# Patient Record
Sex: Female | Born: 1970 | Race: White | Hispanic: No | Marital: Married | Smoking: Former smoker
Health system: Southern US, Community
[De-identification: ages and names within clinical notes are randomized; demographics above are authoritative.]

## PROBLEM LIST (undated history)

## (undated) DIAGNOSIS — G43909 Migraine, unspecified, not intractable, without status migrainosus: Secondary | ICD-10-CM

## (undated) DIAGNOSIS — F329 Major depressive disorder, single episode, unspecified: Secondary | ICD-10-CM

## (undated) DIAGNOSIS — F32A Depression, unspecified: Secondary | ICD-10-CM

## (undated) HISTORY — PX: CHOLECYSTECTOMY: SHX55

---

## 2017-05-02 ENCOUNTER — Encounter (HOSPITAL_COMMUNITY): Payer: Self-pay | Admitting: Emergency Medicine

## 2017-05-02 ENCOUNTER — Emergency Department (HOSPITAL_COMMUNITY)
Admission: EM | Admit: 2017-05-02 | Discharge: 2017-05-03 | Disposition: A | Discharge status: Home / Self Care | Payer: BLUE CROSS/BLUE SHIELD | Attending: Emergency Medicine | Admitting: Emergency Medicine

## 2017-05-02 DIAGNOSIS — Z87891 Personal history of nicotine dependence: Secondary | ICD-10-CM | POA: Insufficient documentation

## 2017-05-02 DIAGNOSIS — D649 Anemia, unspecified: Secondary | ICD-10-CM

## 2017-05-02 DIAGNOSIS — R102 Pelvic and perineal pain: Secondary | ICD-10-CM

## 2017-05-02 DIAGNOSIS — D259 Leiomyoma of uterus, unspecified: Secondary | ICD-10-CM | POA: Diagnosis not present

## 2017-05-02 HISTORY — DX: Migraine, unspecified, not intractable, without status migrainosus: G43.909

## 2017-05-02 HISTORY — DX: Depression, unspecified: F32.A

## 2017-05-02 HISTORY — DX: Major depressive disorder, single episode, unspecified: F32.9

## 2017-05-02 NOTE — ED Triage Notes (Signed)
Pt states that she is having lower abd pain that started at [place] she is having problems with her utriesus prolapsing it is hurting in her back and lower. She was given 50 fentanyl in route.

## 2017-05-03 ENCOUNTER — Emergency Department (HOSPITAL_COMMUNITY): Payer: BLUE CROSS/BLUE SHIELD

## 2017-05-03 LAB — COMPREHENSIVE METABOLIC PANEL
ALT: 32 U/L (ref 14–54)
ANION GAP: 9 (ref 5–15)
AST: 23 U/L (ref 15–41)
Albumin: 4.5 g/dL (ref 3.5–5.0)
Alkaline Phosphatase: 86 U/L (ref 38–126)
BILIRUBIN TOTAL: 0.8 mg/dL (ref 0.3–1.2)
BUN: 17 mg/dL (ref 6–20)
CO2: 24 mmol/L (ref 22–32)
Calcium: 9.1 mg/dL (ref 8.9–10.3)
Chloride: 108 mmol/L (ref 101–111)
Creatinine, Ser: 0.79 mg/dL (ref 0.44–1.00)
Glucose, Bld: 114 mg/dL — ABNORMAL HIGH (ref 65–99)
POTASSIUM: 4.1 mmol/L (ref 3.5–5.1)
Sodium: 141 mmol/L (ref 135–145)
TOTAL PROTEIN: 7.1 g/dL (ref 6.5–8.1)

## 2017-05-03 LAB — CBC WITH DIFFERENTIAL/PLATELET
BASOS ABS: 0 10*3/uL (ref 0.0–0.1)
BASOS PCT: 0 %
Eosinophils Absolute: 0 10*3/uL (ref 0.0–0.7)
Eosinophils Relative: 0 %
HEMATOCRIT: 33.9 % — AB (ref 36.0–46.0)
Hemoglobin: 11.6 g/dL — ABNORMAL LOW (ref 12.0–15.0)
LYMPHS PCT: 8 %
Lymphs Abs: 0.9 10*3/uL (ref 0.7–4.0)
MCH: 31.9 pg (ref 26.0–34.0)
MCHC: 34.2 g/dL (ref 30.0–36.0)
MCV: 93.1 fL (ref 78.0–100.0)
Monocytes Absolute: 0.6 10*3/uL (ref 0.1–1.0)
Monocytes Relative: 5 %
NEUTROS ABS: 10.2 10*3/uL — AB (ref 1.7–7.7)
Neutrophils Relative %: 87 %
PLATELETS: 231 10*3/uL (ref 150–400)
RBC: 3.64 MIL/uL — AB (ref 3.87–5.11)
RDW: 12.4 % (ref 11.5–15.5)
WBC: 11.8 10*3/uL — AB (ref 4.0–10.5)

## 2017-05-03 LAB — URINALYSIS, ROUTINE W REFLEX MICROSCOPIC
Bilirubin Urine: NEGATIVE
Glucose, UA: NEGATIVE mg/dL
Hgb urine dipstick: NEGATIVE
KETONES UR: NEGATIVE mg/dL
LEUKOCYTES UA: NEGATIVE
NITRITE: NEGATIVE
PH: 6 (ref 5.0–8.0)
Protein, ur: NEGATIVE mg/dL
Specific Gravity, Urine: 1.012 (ref 1.005–1.030)

## 2017-05-03 LAB — POC URINE PREG, ED: Preg Test, Ur: NEGATIVE

## 2017-05-03 LAB — I-STAT CG4 LACTIC ACID, ED: Lactic Acid, Venous: 1.67 mmol/L (ref 0.5–1.9)

## 2017-05-03 MED ORDER — SODIUM CHLORIDE 0.9 % IV BOLUS (SEPSIS)
1000.0000 mL | Freq: Once | INTRAVENOUS | Status: AC
  Administered 2017-05-03: 1000 mL via INTRAVENOUS

## 2017-05-03 MED ORDER — OXYCODONE-ACETAMINOPHEN 5-325 MG PO TABS
1.0000 | ORAL_TABLET | Freq: Once | ORAL | Status: AC
  Administered 2017-05-03: 1 via ORAL
  Filled 2017-05-03: qty 1

## 2017-05-03 MED ORDER — OXYCODONE-ACETAMINOPHEN 5-325 MG PO TABS: 1.0000 | ORAL_TABLET | ORAL | 0 refills | Status: AC | PRN

## 2017-05-03 MED ORDER — MORPHINE SULFATE (PF) 4 MG/ML IV SOLN
4.0000 mg | Freq: Once | INTRAVENOUS | Status: AC
  Administered 2017-05-03: 4 mg via INTRAVENOUS
  Filled 2017-05-03: qty 1

## 2017-05-03 MED ORDER — IOPAMIDOL (ISOVUE-300) INJECTION 61%
100.0000 mL | Freq: Once | INTRAVENOUS | Status: AC | PRN
  Administered 2017-05-03: 100 mL via INTRAVENOUS

## 2017-05-03 MED ORDER — ONDANSETRON HCL 4 MG PO TABS: 4.0000 mg | ORAL_TABLET | Freq: Four times a day (QID) | ORAL | 0 refills | Status: AC | PRN

## 2017-05-03 MED ORDER — ONDANSETRON HCL 4 MG/2ML IJ SOLN
4.0000 mg | Freq: Once | INTRAMUSCULAR | Status: AC
  Administered 2017-05-03: 4 mg via INTRAVENOUS
  Filled 2017-05-03: qty 2

## 2017-05-03 NOTE — ED Notes (Signed)
Family at bedside.pt resting in bed pt giving something to drink

## 2017-05-03 NOTE — ED Provider Notes (Signed)
[facility] DEPT Provider Note   CSN: [id number] Arrival date & time: 05/02/17  2330     History   Chief Complaint Chief Complaint  Patient presents with  . Abdominal Pain    HPI Robin Buck is a 46 y.o. female.  The history is provided by the patient.  She has been having vaginal and pelvic pain for the last 7 weeks and was diagnosed with uterine prolapse. She has been referred to a gynecologist in [place], but appointment is not for another 5 weeks. Tonight, she started having severe pain in the vaginal area and across the pubic area with some radiation to the back. She rated pain at 10/10. This is associated with chills, sweats, nausea. She did not take anything for pain. She called for an ambulance who gave her fentanyl which did give her significant relief. She also received ondansetron with significant relief of nausea. She states pain is now starting to come back. She has been diagnosed with a uterine fibroids. She has not had a menses in the last 5 years since birth of her child, and has been found to not be producing estrogen.  Past Medical History:  Diagnosis Date  . Depression   . Migraines     There are no active problems to display for this patient.   Past Surgical History:  Procedure Laterality Date  . CHOLECYSTECTOMY      OB History    No data available       Home Medications    Prior to Admission medications   Not on File    Family History History reviewed. No pertinent family history.  Social History Social History  Substance Use Topics  . Smoking status: Former Smoker    Quit date: 12/30/2016  . Smokeless tobacco: Never Used  . Alcohol use No     Allergies   No known allergies   Review of Systems Review of Systems  All other systems reviewed and are negative.    Physical Exam Updated Vital Signs BP (!) 102/57   Pulse 87   Temp 99.4 F (37.4 C) (Oral)   Resp 16   Ht 5' 5.5" (1.664 m)   Wt 64.4 kg (142 lb)    SpO2 99%   BMI 23.27 kg/m   Physical Exam  Nursing note and vitals reviewed.  46 year old female, resting comfortably and in no acute distress. Vital signs are normal. Oxygen saturation is 99%, which is normal. Head is normocephalic and atraumatic. PERRLA, EOMI. Oropharynx is clear. Neck is nontender and supple without adenopathy or JVD. Back is nontender and there is no CVA tenderness. Lungs are clear without rales, wheezes, or rhonchi. Chest is nontender. Heart has regular rate and rhythm without murmur. Abdomen is soft, flat, with mild to moderate tenderness across the suprapubic area. There are no masses or hepatosplenomegaly and peristalsis is hypoactive. Pelvic: Normal external female genitalia. Cervix appears normal and is in normal position. No evidence of uterine prolapse. Bimanual exam is difficult secondary to atrophy of the introitus, but there is bilateral adnexal tenderness. Extremities have no cyanosis or edema, full range of motion is present. Skin is warm and dry without rash. Neurologic: Mental status is normal, cranial nerves are intact, there are no motor or sensory deficits.  ED Treatments / Results  Labs (all labs ordered are listed, but only abnormal results are displayed) Labs Reviewed  COMPREHENSIVE METABOLIC PANEL - Abnormal; Notable for the following:       Result Value  Glucose, Bld 114 (*)    All other components within normal limits  CBC WITH DIFFERENTIAL/PLATELET - Abnormal; Notable for the following:    WBC 11.8 (*)    RBC 3.64 (*)    Hemoglobin 11.6 (*)    HCT 33.9 (*)    Neutro Abs 10.2 (*)    All other components within normal limits  URINALYSIS, ROUTINE W REFLEX MICROSCOPIC  I-STAT CG4 LACTIC ACID, ED  POC URINE PREG, ED     Radiology US Pelvis Transvanginal Non-ob (tv Only)  Result Date: 05/03/2017 CLINICAL DATA:  Pelvic pain for 6 weeks. Unsure last menstrual period. Fibroid found on CT today. EXAM: TRANSABDOMINAL AND TRANSVAGINAL  ULTRASOUND OF PELVIS DOPPLER ULTRASOUND OF OVARIES TECHNIQUE: Both transabdominal and transvaginal ultrasound examinations of the pelvis were performed. Transabdominal technique was performed for global imaging of the pelvis including uterus, ovaries, adnexal regions, and pelvic cul-de-sac. It was necessary to proceed with endovaginal exam following the transabdominal exam to visualize the uterus and ovaries. Color and duplex Doppler ultrasound was utilized to evaluate blood flow to the ovaries. COMPARISON:  CT abdomen and pelvis 05/03/2017 FINDINGS: Uterus Measurements: 6.5 x 3 x 3.6 cm. Uterus is anteverted. Heterogeneous myometrium with anterior calcification consistent with fibroids. Endometrium Thickness: 2.5 mm. Focal echogenic structure appears to be within the myometrium. A focal endometrial lesion is not excluded. Consider short-term follow-up versus on a histogram for further evaluation. Right ovary Right ovary is not visualized due to bowel gas. Left ovary Left ovary is not visualized due to bowel gas. Pulsed Doppler evaluation of the ovaries could not be obtained as they were not visualized. Color flow Doppler imaging of the uterus shows normal homogeneous flow. Other findings No abnormal free fluid. IMPRESSION: Calcified fibroids in the uterus. Normal endometrial stripe thickness but suggestion of focal echogenic structure. Focal endometrial lesion or polyp not excluded. Consider further evaluation with sonohysterogram for confirmation prior to hysteroscopy. Endometrial sampling should also be considered if patient is at high risk for endometrial carcinoma. (Ref: Radiological Reasoning: Algorithmic Workup of Abnormal Vaginal Bleeding with Endovaginal Sonography and Sonohysterography. AJR 2008; 762:G31-51) Ovaries were not visualized due to overlying bowel gas. Electronically Signed   By: Lucienne Capers M.D.   On: 05/03/2017 07:04   US Pelvis Complete  Result Date: 05/03/2017 CLINICAL DATA:  Pelvic  pain for 6 weeks. Unsure last menstrual period. Fibroid found on CT today. EXAM: TRANSABDOMINAL AND TRANSVAGINAL ULTRASOUND OF PELVIS DOPPLER ULTRASOUND OF OVARIES TECHNIQUE: Both transabdominal and transvaginal ultrasound examinations of the pelvis were performed. Transabdominal technique was performed for global imaging of the pelvis including uterus, ovaries, adnexal regions, and pelvic cul-de-sac. It was necessary to proceed with endovaginal exam following the transabdominal exam to visualize the uterus and ovaries. Color and duplex Doppler ultrasound was utilized to evaluate blood flow to the ovaries. COMPARISON:  CT abdomen and pelvis 05/03/2017 FINDINGS: Uterus Measurements: 6.5 x 3 x 3.6 cm. Uterus is anteverted. Heterogeneous myometrium with anterior calcification consistent with fibroids. Endometrium Thickness: 2.5 mm. Focal echogenic structure appears to be within the myometrium. A focal endometrial lesion is not excluded. Consider short-term follow-up versus on a histogram for further evaluation. Right ovary Right ovary is not visualized due to bowel gas. Left ovary Left ovary is not visualized due to bowel gas. Pulsed Doppler evaluation of the ovaries could not be obtained as they were not visualized. Color flow Doppler imaging of the uterus shows normal homogeneous flow. Other findings No abnormal free fluid. IMPRESSION: Calcified fibroids in  the uterus. Normal endometrial stripe thickness but suggestion of focal echogenic structure. Focal endometrial lesion or polyp not excluded. Consider further evaluation with sonohysterogram for confirmation prior to hysteroscopy. Endometrial sampling should also be considered if patient is at high risk for endometrial carcinoma. (Ref: Radiological Reasoning: Algorithmic Workup of Abnormal Vaginal Bleeding with Endovaginal Sonography and Sonohysterography. AJR 2008; 778:E42-35) Ovaries were not visualized due to overlying bowel gas. Electronically Signed   By:  Lucienne Capers M.D.   On: 05/03/2017 07:04   Ct Abdomen Pelvis W Contrast  Result Date: 05/03/2017 CLINICAL DATA:  Lower abdominal pain. Problems with uterine prolapse causing pain in the low back. EXAM: CT ABDOMEN AND PELVIS WITH CONTRAST TECHNIQUE: Multidetector CT imaging of the abdomen and pelvis was performed using the standard protocol following bolus administration of intravenous contrast. CONTRAST:  151mL ISOVUE-300 IOPAMIDOL (ISOVUE-300) INJECTION 61% COMPARISON:  None. FINDINGS: Lower chest: The lung bases are clear. Hepatobiliary: No focal liver lesions. Surgical absence of the gallbladder. Mild intra and extrahepatic bile duct dilatation is likely normal for postoperative physiology. Pancreas: Unremarkable. No pancreatic ductal dilatation or surrounding inflammatory changes. Spleen: Normal in size without focal abnormality. Adrenals/Urinary Tract: Adrenal glands are unremarkable. Kidneys are normal, without renal calculi, focal lesion, or hydronephrosis. Bladder is unremarkable. Stomach/Bowel: Stomach, small bowel, and colon are not abnormally distended. No wall thickening is appreciated. No inflammatory changes. Appendix is normal. Fluid-filled colon may be associated with diarrhea. Vascular/Lymphatic: Few scattered calcifications in the aorta. No aneurysm. No significant lymphadenopathy. Reproductive: Uterus and ovaries are not enlarged. Calcification in the anterior uterus probably represents a small calcified fibroid. Other: No abdominal wall hernia or abnormality. No abdominopelvic ascites. Musculoskeletal: No acute or significant osseous findings. IMPRESSION: No acute process demonstrated in the abdomen or pelvis. No evidence of bowel obstruction or inflammation. Small calcified uterine fibroid. Electronically Signed   By: Lucienne Capers M.D.   On: 05/03/2017 04:20   US Pelvic Doppler (torsion R/o Or Mass Arterial Flow)  Result Date: 05/03/2017 CLINICAL DATA:  Pelvic pain for 6 weeks.  Unsure last menstrual period. Fibroid found on CT today. EXAM: TRANSABDOMINAL AND TRANSVAGINAL ULTRASOUND OF PELVIS DOPPLER ULTRASOUND OF OVARIES TECHNIQUE: Both transabdominal and transvaginal ultrasound examinations of the pelvis were performed. Transabdominal technique was performed for global imaging of the pelvis including uterus, ovaries, adnexal regions, and pelvic cul-de-sac. It was necessary to proceed with endovaginal exam following the transabdominal exam to visualize the uterus and ovaries. Color and duplex Doppler ultrasound was utilized to evaluate blood flow to the ovaries. COMPARISON:  CT abdomen and pelvis 05/03/2017 FINDINGS: Uterus Measurements: 6.5 x 3 x 3.6 cm. Uterus is anteverted. Heterogeneous myometrium with anterior calcification consistent with fibroids. Endometrium Thickness: 2.5 mm. Focal echogenic structure appears to be within the myometrium. A focal endometrial lesion is not excluded. Consider short-term follow-up versus on a histogram for further evaluation. Right ovary Right ovary is not visualized due to bowel gas. Left ovary Left ovary is not visualized due to bowel gas. Pulsed Doppler evaluation of the ovaries could not be obtained as they were not visualized. Color flow Doppler imaging of the uterus shows normal homogeneous flow. Other findings No abnormal free fluid. IMPRESSION: Calcified fibroids in the uterus. Normal endometrial stripe thickness but suggestion of focal echogenic structure. Focal endometrial lesion or polyp not excluded. Consider further evaluation with sonohysterogram for confirmation prior to hysteroscopy. Endometrial sampling should also be considered if patient is at high risk for endometrial carcinoma. (Ref: Radiological Reasoning: Algorithmic Workup of Abnormal  Vaginal Bleeding with Endovaginal Sonography and Sonohysterography. AJR 2008; 381:W29-93) Ovaries were not visualized due to overlying bowel gas. Electronically Signed   By: Lucienne Capers M.D.    On: 05/03/2017 07:04    Procedures Procedures (including critical care time)  Medications Ordered in ED Medications  oxyCODONE-acetaminophen (PERCOCET/ROXICET) 5-325 MG per tablet 1 tablet (not administered)  ondansetron (ZOFRAN) injection 4 mg (4 mg Intravenous Given 05/03/17 0019)  sodium chloride 0.9 % bolus 1,000 mL (1,000 mLs Intravenous New Bag/Given 05/03/17 0242)  morphine 4 MG/ML injection 4 mg (4 mg Intravenous Given 05/03/17 0242)  morphine 4 MG/ML injection 4 mg (4 mg Intravenous Given 05/03/17 0335)  iopamidol (ISOVUE-300) 61 % injection 100 mL (100 mLs Intravenous Contrast Given 05/03/17 0358)  morphine 4 MG/ML injection 4 mg (4 mg Intravenous Given 05/03/17 0443)     Initial Impression / Assessment and Plan / ED Course  I have reviewed the triage vital signs and the nursing notes.  Pertinent labs & imaging results that were available during my care of the patient were reviewed by me and considered in my medical decision making (see chart for details).  Pelvic pain in patient with known history of uterine prolapse and uterine fibroids. She'll be given IV fluids, morphine for pain. Screening labs obtained including lactic acid level. She has no prior records and the [facility] system.  Exam x-ray shows no evidence of uterine prolapse. She will be sent for CT of abdomen and pelvis.  CT scan does not show any obvious cause for pain. Patient has been getting adequate relief of pain with morphine, but pain has recurred several times. She will be sent for pelvic ultrasound to rule out ovarian torsion or torsion of uterine fibroid.  Pelvic ultrasound does show presence of uterine fibroids, but is otherwise unremarkable. Laboratory workup shows mild leukocytosis, mild anemia, and otherwise unremarkable. No clear cause for patient's pain is identified. At this point, she does seem to get adequate pain relief with narcotics, so she is sent home with prescription for oxycodone  acetaminophen and ondansetron. She is referred to gynecology for follow-up. Return precautions discussed.  Final Clinical Impressions(s) / ED Diagnoses   Final diagnoses:  Pelvic pain in female  Uterine leiomyoma, unspecified location  Normochromic normocytic anemia    New Prescriptions New Prescriptions   ONDANSETRON (ZOFRAN) 4 MG TABLET    Take 1 tablet (4 mg total) by mouth every 6 (six) hours as needed.   OXYCODONE-ACETAMINOPHEN (PERCOCET) 5-325 MG TABLET    Take 1 tablet by mouth every 4 (four) hours as needed for moderate pain.     Delora Fuel, MD 71/69/67 [phone]

## 2017-05-03 NOTE — Discharge Instructions (Signed)
The cause of your pain was not clear today. Please make an appointment with the gynecologist for further evaluation. Return to the emergency department if pain is not being adequately controlled at home, if you are vomiting, or if you start running a high fever.

## 2017-05-03 NOTE — ED Notes (Signed)
ED Provider at bedside. 

## 2017-05-05 ENCOUNTER — Encounter: Payer: Self-pay | Admitting: Obstetrics & Gynecology

## 2017-05-05 ENCOUNTER — Telehealth: Payer: Self-pay | Admitting: *Deleted

## 2017-05-05 ENCOUNTER — Ambulatory Visit (INDEPENDENT_AMBULATORY_CARE_PROVIDER_SITE_OTHER): Payer: BLUE CROSS/BLUE SHIELD | Admitting: Obstetrics & Gynecology

## 2017-05-05 VITALS — BP 100/70 | HR 72 | Wt 144.0 lb

## 2017-05-05 DIAGNOSIS — R102 Pelvic and perineal pain: Secondary | ICD-10-CM

## 2017-05-05 MED ORDER — CIPROFLOXACIN HCL 500 MG PO TABS: 500.0000 mg | ORAL_TABLET | Freq: Two times a day (BID) | ORAL | 0 refills | Status: AC

## 2017-05-05 MED ORDER — DICYCLOMINE HCL 20 MG PO TABS: 20.0000 mg | ORAL_TABLET | Freq: Three times a day (TID) | ORAL | 1 refills | Status: AC

## 2017-05-05 MED ORDER — METRONIDAZOLE 500 MG PO TABS: 500.0000 mg | ORAL_TABLET | Freq: Two times a day (BID) | ORAL | 0 refills | Status: AC

## 2017-05-05 NOTE — Telephone Encounter (Signed)
Patient called with complaints of continued pain. She is taking the Percocet but states she has a 47 year old at home, so taking them regularly is not ideal. She is noted to have uterine fibroids and wants to be evaluated ASAP for possible hysterectomy. Was told she could see dr Glo Herring today but informed he was not in the office until Wednesday.  Will w/i with Dr Elonda Husky.

## 2017-05-05 NOTE — Progress Notes (Signed)
Chief Complaint  Patient presents with  . follow-up- ER    fibroids    Blood pressure 100/70, pulse 72, weight 144 lb (65.3 kg).  46 y.o. G3P0 No LMP recorded. The current method of family planning is tubal ligation with her last c section.  Outpatient Encounter Medications as of 05/05/2017  Medication Sig  . ondansetron (ZOFRAN) 4 MG tablet Take 1 tablet (4 mg total) by mouth every 6 (six) hours as needed.  . ciprofloxacin (CIPRO) 500 MG tablet Take 1 tablet (500 mg total) by mouth 2 (two) times daily.  Marland Kitchen dicyclomine (BENTYL) 20 MG tablet Take 1 tablet (20 mg total) by mouth 3 (three) times daily before meals.  . metroNIDAZOLE (FLAGYL) 500 MG tablet Take 1 tablet (500 mg total) by mouth 2 (two) times daily.  Marland Kitchen oxyCODONE-acetaminophen (PERCOCET) 5-325 MG tablet Take 1 tablet by mouth every 4 (four) hours as needed for moderate pain. (Patient not taking: Reported on 05/05/2017)   No facility-administered encounter medications on file as of 05/05/2017.     Subjective And was referred here from my ER visit Essentially told that she had a fibroid that was causing her pain She's been having pain for quite some time along with diarrhea Her pain is very episodic Occurs about once a week is terrible 4-6 hours and completely resolves This is been going on for about the past 6 weeks or so since August 1 She has no fever although she does break out in a cold sweat when she has the pain Nothing makes it better heating pad pain medicine nothing  Objective General WDWN female NAD Vulva:  normal appearing vulva with no masses, tenderness or lesions Vagina:  normal mucosa, no discharge Cervix:  no cervical motion tenderness and no lesions Uterus:  normal size, contour, position, consistency, mobility, non-tender Adnexa: ovaries:present,  normal adnexa in size, nontender and no masses   Pertinent ROS No burning with urination, frequency or urgency No nausea, vomiting or diarrhea Nor  fever chills or other constitutional symptoms   Labs or studies CT scan from outside institution reviewed as well as her labs    Impression Diagnoses this Encounter::   ICD-10-CM   1. Pelvic pain R10.2     Established relevant diagnosis(es):   Plan/Recommendations: Meds ordered this encounter  Medications  . ciprofloxacin (CIPRO) 500 MG tablet    Sig: Take 1 tablet (500 mg total) by mouth 2 (two) times daily.    Dispense:  20 tablet    Refill:  0  . metroNIDAZOLE (FLAGYL) 500 MG tablet    Sig: Take 1 tablet (500 mg total) by mouth 2 (two) times daily.    Dispense:  20 tablet    Refill:  0  . dicyclomine (BENTYL) 20 MG tablet    Sig: Take 1 tablet (20 mg total) by mouth 3 (three) times daily before meals.    Dispense:  90 tablet    Refill:  1    Labs or Scans Ordered: No orders of the defined types were placed in this encounter.   Management:: Honestly I'm pretty baffled about what's going on with Gianny To re-cap She has very episodic pain about once a week which last may be 4-6 hours And it typically resolves and completely goes away She does have a history of diarrhea and irritable bowel syndrome and has been having more of that since August 1 In fact she's had to self staining episodes since her unresolved episode started  on Friday Her CT scan is completely normal and her ultrasound is completely normal To reiterate she does not have pathology related to fibroid she has one calcification which is less than a centimeter and an examination she absolutely positively has no pelvic organ prolapse Her back exam is normal her abdominal exam is relatively benign except for some tenderness in the lower quadrants no rebound So I'm going to treat her empirically with Cipro and Flagyl for 10 days and Bentyl 20 mg 3 times a day, to see if her diarrhea gets better and see what her pain does Either way I think this is helpful to a gastroenterologist if she were to need to go see  them in the future I will see her back in 2 weeks and she is, keep pretty close track of her pain diarrhea and any other associated symptoms between now and then and we will reevaluate then regarding her differential and care management going forward  Follow up Return in about 2 weeks (around 05/19/2017) for Follow up, with Dr Elonda Husky.      All questions were answered.  Past Medical History:  Diagnosis Date  . Depression   . Migraines     Past Surgical History:  Procedure Laterality Date  . CHOLECYSTECTOMY      OB History    Gravida Para Term Preterm AB Living   3         3   SAB TAB Ectopic Multiple Live Births                  Allergies  Allergen Reactions  . No Known Allergies     Social History   Socioeconomic History  . Marital status: Married    Spouse name: None  . Number of children: None  . Years of education: None  . Highest education level: None  Social Needs  . Financial resource strain: None  . Food insecurity - worry: None  . Food insecurity - inability: None  . Transportation needs - medical: None  . Transportation needs - non-medical: None  Occupational History  . None  Tobacco Use  . Smoking status: Former Smoker    Last attempt to quit: 12/30/2016    Years since quitting: 0.5  . Smokeless tobacco: Never Used  Substance and Sexual Activity  . Alcohol use: No  . Drug use: No  . Sexual activity: No  Other Topics Concern  . None  Social History Narrative  . None    History reviewed. No pertinent family history.

## 2017-05-26 ENCOUNTER — Ambulatory Visit: Payer: BLUE CROSS/BLUE SHIELD | Admitting: Obstetrics & Gynecology

## 2018-09-08 IMAGING — CT CT ABD-PELV W/ CM
2 of 4 series · 16 of 46 positions shown, 18 images · IV contrast (Isovue)
Comparison: None.

CLINICAL DATA: Lower abdominal pain. Problems with uterine prolapse
causing pain in the low back.

EXAM:
CT ABDOMEN AND PELVIS WITH CONTRAST
TECHNIQUE: Multidetector CT imaging of the abdomen and pelvis was performed
using the standard protocol following bolus administration of
intravenous contrast.
CONTRAST:  100mL [id number] IOPAMIDOL ([id number]) INJECTION 61%

[Series 2: axial st · axial · 0.73mm/px · z∈[+960,+1400]mm · 13 of 98 slices shown, 15 images]
[im 5/98  soft-tissue]
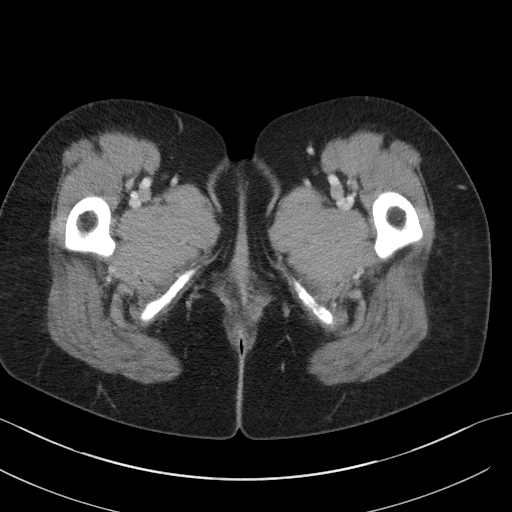
[im 5/98  bone]
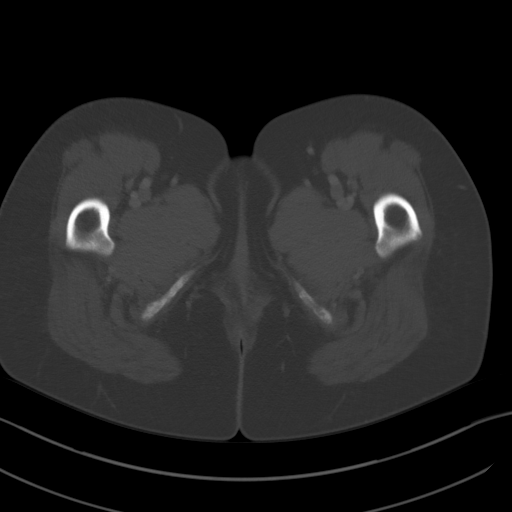
[im 15/98  soft-tissue]
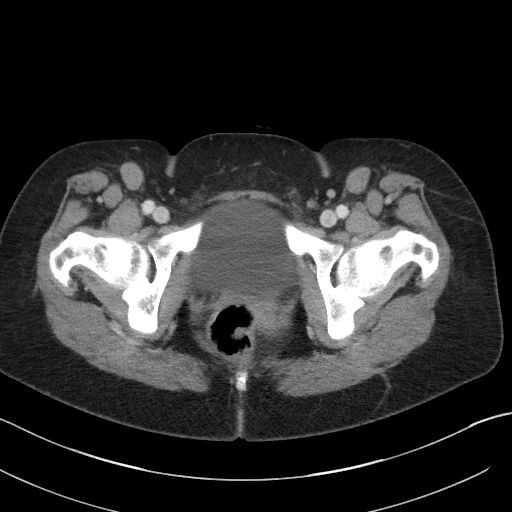
[im 20/98  soft-tissue]
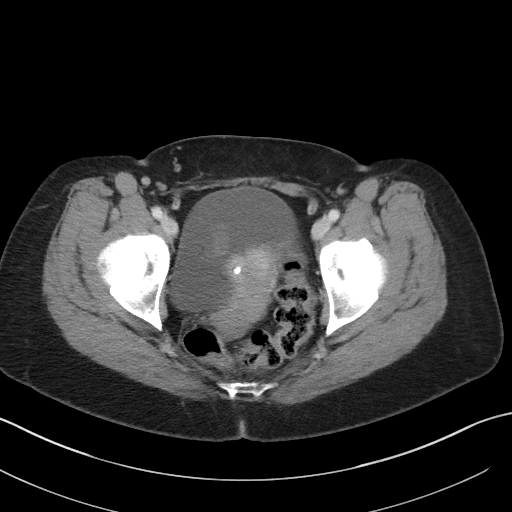
[im 30/98  soft-tissue]
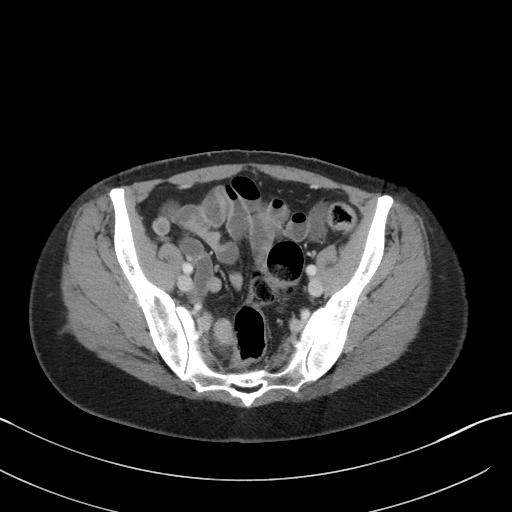
[im 34/98  soft-tissue]
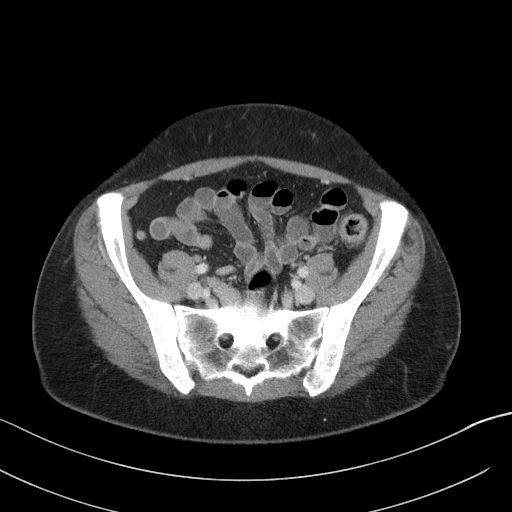
[im 44/98  soft-tissue]
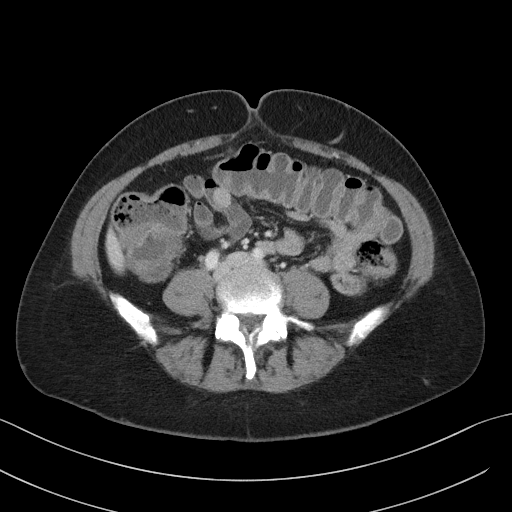
[im 49/98  soft-tissue]
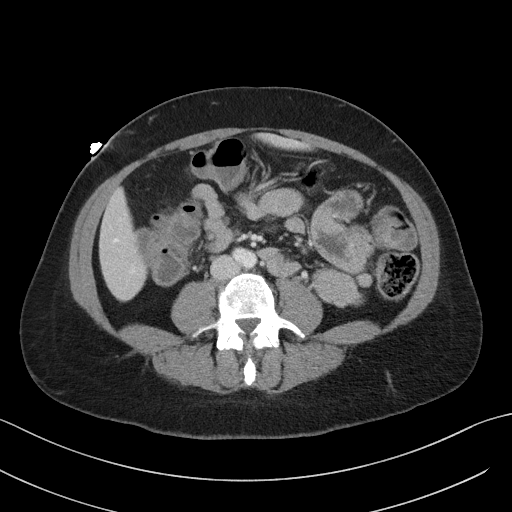
[im 54/98  soft-tissue]
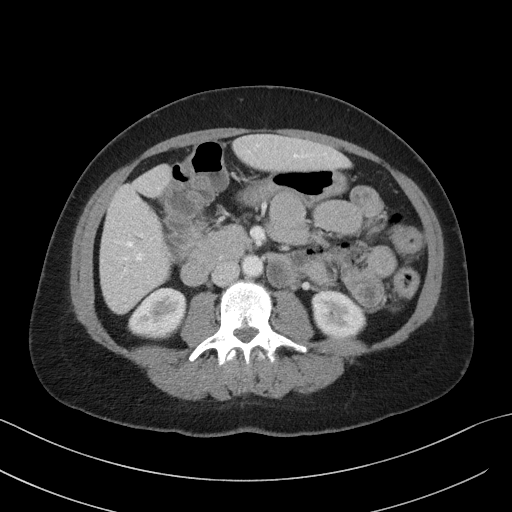
[im 64/98  soft-tissue]
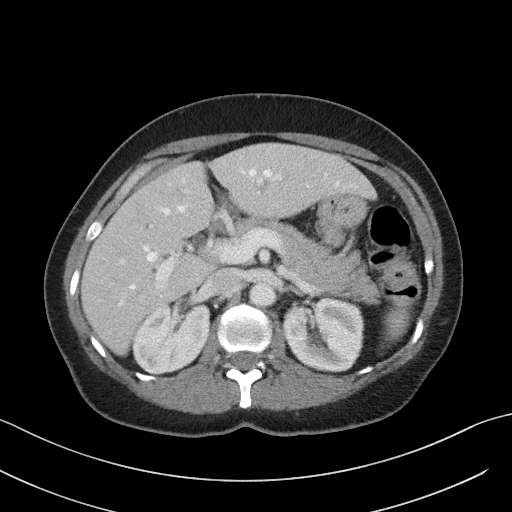
[im 64/98  bone]
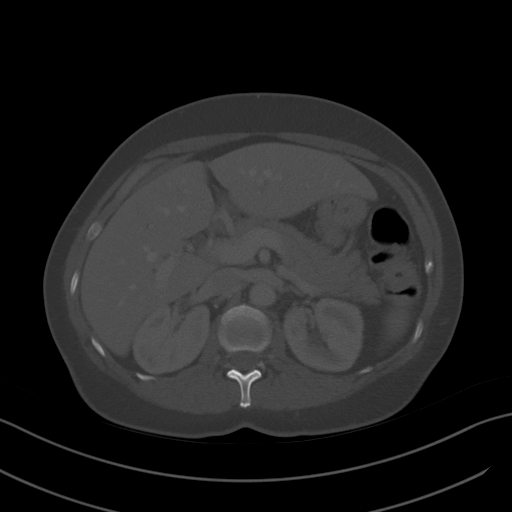
[im 68/98  soft-tissue]
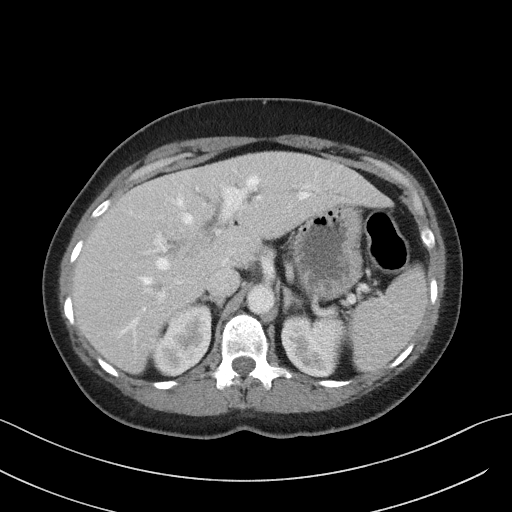
[im 78/98  soft-tissue]
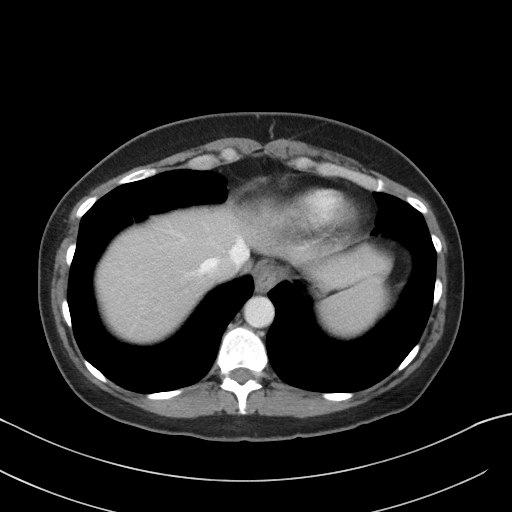
[im 83/98  soft-tissue]
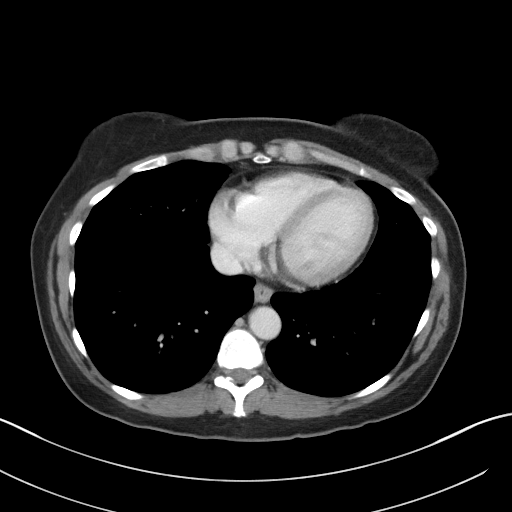
[im 93/98  soft-tissue]
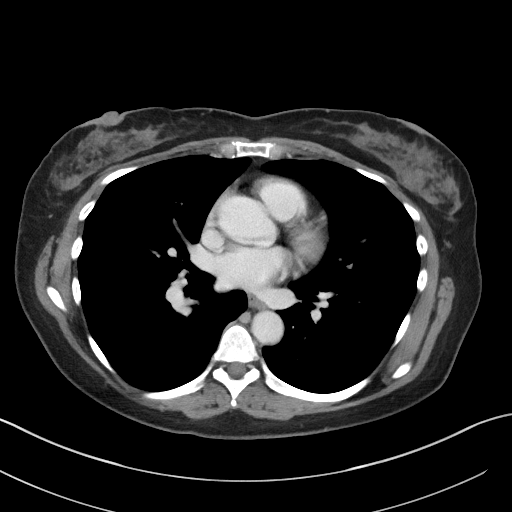

[Series 5: coronal st · coronal · 0.74mm/px · 3 of 99 slices shown]
[im 33/99  soft-tissue]
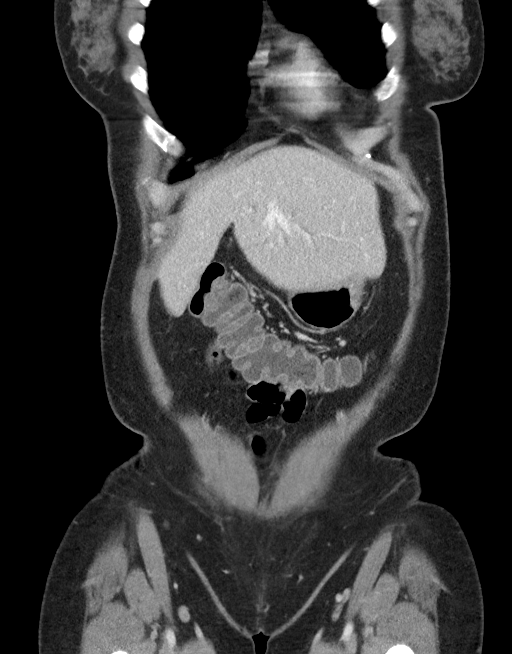
[im 44/99  soft-tissue]
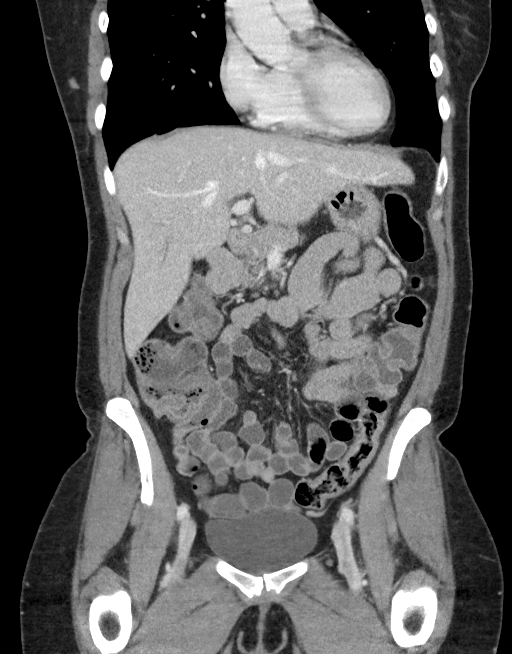
[im 55/99  soft-tissue]
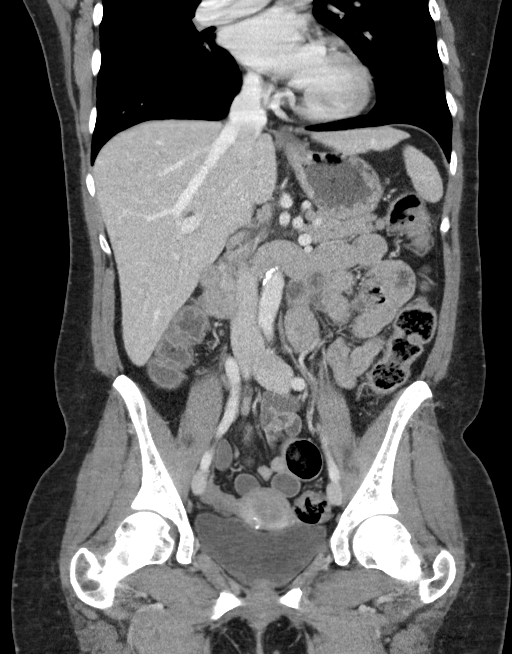

[16 of 46 positions shown; findings below may reference images not displayed]

FINDINGS: Lower chest: The lung bases are clear.

Hepatobiliary: No focal liver lesions. Surgical absence of the
gallbladder. Mild intra and extrahepatic bile duct dilatation is
likely normal for postoperative physiology.

Pancreas: Unremarkable. No pancreatic ductal dilatation or
surrounding inflammatory changes.

Spleen: Normal in size without focal abnormality.

Adrenals/Urinary Tract: Adrenal glands are unremarkable. Kidneys are
normal, without renal calculi, focal lesion, or hydronephrosis.
Bladder is unremarkable.

Stomach/Bowel: Stomach, small bowel, and colon are not abnormally
distended. No wall thickening is appreciated. No inflammatory
changes. Appendix is normal. Fluid-filled colon may be associated
with diarrhea.

Vascular/Lymphatic: Few scattered calcifications in the aorta. No
aneurysm. No significant lymphadenopathy.

Reproductive: Uterus and ovaries are not enlarged. Calcification in
the anterior uterus probably represents a small calcified fibroid.

Other: No abdominal wall hernia or abnormality. No abdominopelvic
ascites.

Musculoskeletal: No acute or significant osseous findings.
IMPRESSION: No acute process demonstrated in the abdomen or pelvis. No evidence
of bowel obstruction or inflammation. Small calcified uterine
fibroid.

## 2019-10-09 ENCOUNTER — Encounter (HOSPITAL_COMMUNITY): Payer: Self-pay | Admitting: Emergency Medicine

## 2019-10-09 ENCOUNTER — Emergency Department (HOSPITAL_COMMUNITY)
Admission: EM | Admit: 2019-10-09 | Discharge: 2019-10-09 | Disposition: A | Discharge status: Home / Self Care | Payer: BC Managed Care – PPO | Attending: Emergency Medicine | Admitting: Emergency Medicine

## 2019-10-09 ENCOUNTER — Emergency Department (HOSPITAL_COMMUNITY): Payer: BC Managed Care – PPO

## 2019-10-09 ENCOUNTER — Other Ambulatory Visit: Payer: Self-pay

## 2019-10-09 DIAGNOSIS — M546 Pain in thoracic spine: Secondary | ICD-10-CM | POA: Diagnosis not present

## 2019-10-09 DIAGNOSIS — Z79899 Other long term (current) drug therapy: Secondary | ICD-10-CM | POA: Insufficient documentation

## 2019-10-09 DIAGNOSIS — Z87891 Personal history of nicotine dependence: Secondary | ICD-10-CM | POA: Insufficient documentation

## 2019-10-09 DIAGNOSIS — R110 Nausea: Secondary | ICD-10-CM | POA: Insufficient documentation

## 2019-10-09 DIAGNOSIS — R079 Chest pain, unspecified: Secondary | ICD-10-CM

## 2019-10-09 DIAGNOSIS — R0789 Other chest pain: Secondary | ICD-10-CM | POA: Insufficient documentation

## 2019-10-09 LAB — BASIC METABOLIC PANEL
Anion gap: 12 (ref 5–15)
BUN: 10 mg/dL (ref 6–20)
CO2: 17 mmol/L — ABNORMAL LOW (ref 22–32)
Calcium: 9.3 mg/dL (ref 8.9–10.3)
Chloride: 111 mmol/L (ref 98–111)
Creatinine, Ser: 0.81 mg/dL (ref 0.44–1.00)
GFR calc Af Amer: >60 mL/min (ref >60)
GFR calc non Af Amer: >60 mL/min (ref >60)
Glucose, Bld: 118 mg/dL — ABNORMAL HIGH (ref 70–99)
Potassium: 3.7 mmol/L (ref 3.5–5.1)
Sodium: 140 mmol/L (ref 135–145)

## 2019-10-09 LAB — CBC
HCT: 36.3 % (ref 36.0–46.0)
Hemoglobin: 11.9 g/dL — ABNORMAL LOW (ref 12.0–15.0)
MCH: 32.2 pg (ref 26.0–34.0)
MCHC: 32.8 g/dL (ref 30.0–36.0)
MCV: 98.4 fL (ref 80.0–100.0)
Platelets: 286 10*3/uL (ref 150–400)
RBC: 3.69 MIL/uL — ABNORMAL LOW (ref 3.87–5.11)
RDW: 12.6 % (ref 11.5–15.5)
WBC: 5.4 10*3/uL (ref 4.0–10.5)
nRBC: 0 % (ref 0.0–0.2)

## 2019-10-09 LAB — TROPONIN I (HIGH SENSITIVITY)
Troponin I (High Sensitivity): <2 ng/L (ref <18)
Troponin I (High Sensitivity): <2 ng/L (ref <18)

## 2019-10-09 MED ORDER — IOHEXOL 350 MG/ML SOLN
100.0000 mL | Freq: Once | INTRAVENOUS | Status: AC | PRN
  Administered 2019-10-09: 14:00:00 100 mL via INTRAVENOUS

## 2019-10-09 MED ORDER — KETOROLAC TROMETHAMINE 30 MG/ML IJ SOLN
15.0000 mg | Freq: Once | INTRAMUSCULAR | Status: AC
  Administered 2019-10-09: 15 mg via INTRAVENOUS
  Filled 2019-10-09: qty 1

## 2019-10-09 NOTE — ED Triage Notes (Addendum)
Pt reports this morning around 2am she began having back pain that radiated to her jaw and then down her arm and later into her chest. She reports she became lightheaded and describes pain as squeezing. 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 Pt a/ox4, resp e/u. Pt did taken her regular tenormin dose this am as well as an 81mg  asa pta.

## 2019-10-09 NOTE — ED Notes (Signed)
Patient verbalizes understanding of discharge instructions. Opportunity for questioning and answers were provided. Armband removed by staff, pt discharged from ED.  

## 2019-10-09 NOTE — ED Provider Notes (Signed)
[facility]PART[facility] Provider Note   CSN: [id number] Arrival date & time: 10/09/19  1055     History Chief Complaint  Patient presents with  . Chest Pain    Robin Buck is a 49 y.o. female.  Patient is a 49 year old female who presents with chest and back pain.  She states she woke up around 2:00 this morning with some sharp discomfort behind her left shoulder blade.  Throughout the night and early morning it started radiating up into her jaw and she could feel that her teeth were aching on the left side.  She said that pain has persisted and later this morning she started having some radiation around to her mid chest which she describes as a pressure feeling in her chest.  She says the pain is been constant since it started.  No history of similar symptoms.  No exertional symptoms.  She cannot really say that anything makes it better or worse.  She has tried ibuprofen as well as an 81 mg aspirin prior to arrival without improvement in symptoms.  She also took a Robaxin without improvement.  She has no associated shortness of breath.  No diaphoresis.  She did have an episode of nausea when the pain came around to her chest.  She is a former smoker but quit 1 year ago.  She has a history of hyperlipidemia but no hypertension or diabetes.  She does state her dad had a heart attack in his early 40s.  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        Past Medical History:  Diagnosis Date  . Depression   . Migraines     There are no problems to display for this patient.   Past Surgical History:  Procedure Laterality Date  . CHOLECYSTECTOMY       OB History    Gravida  3   Para      Term      Preterm      AB      Living  3     SAB      TAB      Ectopic      Multiple      Live Births              No  family history on file.  Social History   Tobacco Use  . Smoking status: Former Smoker    Quit date: 12/30/2016    Years since quitting: 2.7  . Smokeless tobacco: Never Used  Substance Use Topics  . Alcohol use: No  . Drug use: No    Home Medications Prior to Admission medications   Medication Sig Start Date End Date Taking? Authorizing Provider  Ascorbic Acid (VITA-C PO) Take 1 tablet by mouth daily.   Yes [provider]  atenolol (TENORMIN) 25 MG tablet Take 25 mg by mouth daily. 07/28/19  Yes [provider]  atorvastatin (LIPITOR) 20 MG tablet Take 20 mg by mouth every evening.  07/28/19  Yes [provider]  LORazepam (ATIVAN) 1 MG tablet Take 1 mg by mouth 3 (three) times daily as needed for anxiety.  09/23/19  Yes [provider]  methocarbamol (ROBAXIN) 500 MG tablet Take 500 mg by mouth 4 (four) times daily. 09/23/19  Yes [provider]  Multiple Vitamins-Minerals (ZINC PO) Take  1 tablet by mouth daily.   Yes [provider]  sertraline (ZOLOFT) 100 MG tablet Take 100 mg by mouth daily. 07/28/19  Yes [provider]  topiramate (TOPAMAX) 100 MG tablet Take 100 mg by mouth 2 (two) times daily. 07/28/19  Yes [provider]  VITAMIN D PO Take 1 tablet by mouth daily.   Yes [provider]  ciprofloxacin (CIPRO) 500 MG tablet Take 1 tablet (500 mg total) by mouth 2 (two) times daily. Patient not taking: Reported on 10/09/2019 05/05/17   Florian Buff, MD  dicyclomine (BENTYL) 20 MG tablet Take 1 tablet (20 mg total) by mouth 3 (three) times daily before meals. Patient not taking: Reported on 10/09/2019 05/05/17   Florian Buff, MD  metroNIDAZOLE (FLAGYL) 500 MG tablet Take 1 tablet (500 mg total) by mouth 2 (two) times daily. Patient not taking: Reported on 10/09/2019 05/05/17   Florian Buff, MD  ondansetron (ZOFRAN) 4 MG tablet Take 1 tablet (4 mg total) by mouth every 6 (six) hours as  needed. Patient not taking: Reported on [id number]   Delora Fuel, MD  oxyCODONE-acetaminophen (PERCOCET) 5-325 MG tablet Take 1 tablet by mouth every 4 (four) hours as needed for moderate pain. Patient not taking: Reported on [id number]   Delora Fuel, MD    Allergies    No known allergies  Review of Systems   Review of Systems  Constitutional: Negative for chills, diaphoresis, fatigue and fever.  HENT: Negative for congestion, rhinorrhea and sneezing.   Eyes: Negative.   Respiratory: Negative for cough, chest tightness and shortness of breath.   Cardiovascular: Positive for chest pain. Negative for leg swelling.  Gastrointestinal: Positive for nausea. Negative for abdominal pain, blood in stool, diarrhea and vomiting.  Genitourinary: Negative for difficulty urinating, flank pain, frequency and hematuria.  Musculoskeletal: Positive for back pain. Negative for arthralgias.  Skin: Negative for rash.  Neurological: Negative for dizziness, speech difficulty, weakness, numbness and headaches.    Physical Exam Updated Vital Signs BP 108/67   Pulse (!) 59   Temp 98 F (36.7 C) (Oral)   Resp 19   SpO2 97%   Physical Exam Constitutional:      Appearance: She is well-developed.  HENT:     Head: Normocephalic and atraumatic.  Eyes:     Pupils: Pupils are equal, round, and reactive to light.  Cardiovascular:     Rate and Rhythm: Normal rate and regular rhythm.     Heart sounds: Normal heart sounds.  Pulmonary:     Effort: Pulmonary effort is normal. No respiratory distress.     Breath sounds: Normal breath sounds. No wheezing or rales.  Chest:     Chest wall: No tenderness.  Abdominal:     General: Bowel sounds are normal.     Palpations: Abdomen is soft.     Tenderness: There is no abdominal tenderness. There is no guarding or rebound.  Musculoskeletal:        General: Normal range of motion.     Cervical back: Normal range of motion and neck supple.      Comments: No edema or calf tenderness  Lymphadenopathy:     Cervical: No cervical adenopathy.  Skin:    General: Skin is warm and dry.     Findings: No rash.  Neurological:     Mental Status: She is alert and oriented to person, place, and time.     ED Results / Procedures / Treatments  Labs (all labs ordered are listed, but only abnormal results are displayed) Labs Reviewed  BASIC METABOLIC PANEL - Abnormal; Notable for the following components:      Result Value   CO2 17 (*)    Glucose, Bld 118 (*)    All other components within normal limits  CBC - Abnormal; Notable for the following components:   RBC 3.69 (*)    Hemoglobin 11.9 (*)    All other components within normal limits  TROPONIN I (HIGH SENSITIVITY)  TROPONIN I (HIGH SENSITIVITY)    EKG EKG Interpretation  Date/Time:  Saturday October 09 2019 14:46:33 EST Ventricular Rate:  66 PR Interval:  154 QRS Duration: 100 QT Interval:  407 QTC Calculation: 427 R Axis:   63 Text Interpretation: Sinus rhythm RSR' in V1 or V2, right VCD or RVH since last tracing no significant change Confirmed by Malvin Johns [phone]) on 10/09/2019 3:06:16 PM   Radiology CT ANGIO CHEST AORTA W/CM & OR WO/CM  Addendum Date: 10/09/2019   ADDENDUM REPORT: 10/09/2019 15:00 ADDENDUM: Addendum to note normal contour and caliber of the aorta without evidence of aneurysm, dissection, or other acute aortic pathology. No significant atherosclerosis. Examination submitted for review demonstrates a technically well opacified thoracic aorta as well as pulmonary artery system. Electronically Signed   By: Eddie Candle M.D.   On: 10/09/2019 15:00   Result Date: 10/09/2019 CLINICAL DATA:  Chest pain, shoulder blade pain EXAM: CT ANGIOGRAPHY CHEST WITH CONTRAST TECHNIQUE: Multidetector CT imaging of the chest was performed using the standard protocol during bolus administration of intravenous contrast. Multiplanar CT image reconstructions and MIPs were  obtained to evaluate the vascular anatomy. CONTRAST:  129mL OMNIPAQUE IOHEXOL 350 MG/ML SOLN COMPARISON:  None. FINDINGS: Cardiovascular: Satisfactory opacification of the pulmonary arteries to the segmental level. No evidence of pulmonary embolism. Normal heart size. No pericardial effusion. Mediastinum/Nodes: No enlarged mediastinal, hilar, or axillary lymph nodes. Thyroid gland, trachea, and esophagus demonstrate no significant findings. Lungs/Pleura: Innumerable tiny centrilobular pulmonary nodules. No pleural effusion or pneumothorax. Upper Abdomen: No acute abnormality. Musculoskeletal: No chest wall abnormality. No acute or significant osseous findings. Review of the MIP images confirms the above findings. IMPRESSION: 1.  Negative examination for pulmonary embolism. 2. Innumerable tiny centrilobular pulmonary nodules, likely smoking-related respiratory bronchiolitis or other nonspecific infection or inflammation. Electronically Signed: By: Eddie Candle M.D. On: 10/09/2019 14:33    Procedures Procedures (including critical care time)  Medications Ordered in ED Medications  iohexol (OMNIPAQUE) 350 MG/ML injection 100 mL (100 mLs Intravenous Contrast Given 10/09/19 1400)  ketorolac (TORADOL) 30 MG/ML injection 15 mg (15 mg Intravenous Given 10/09/19 1517)    ED Course  I have reviewed the triage vital signs and the nursing notes.  Pertinent labs & imaging results that were available during my care of the patient were reviewed by me and considered in my medical decision making (see chart for details).    MDM Rules/Calculators/A&P                      Patient presents with chest pain that starts behind her shoulder blade and goes to her chest and neck.  Is not exertional.  She has no associated symptoms.  She has had 2 - troponins in the ED.  No ischemic changes on EKG.  Her other labs are nonconcerning.  Her CT of her chest was done to look at her aorta.  This has a normal-appearing aorta per  the radiologist.  No  evidence of PE or dissection.  No evidence of pneumonia.  She feels better after dose of Toradol and has minimal symptoms currently.  She was discharged home in good condition.  She was encouraged to follow-up with her PCP.  Return precautions were given. Final Clinical Impression(s) / ED Diagnoses Final diagnoses:  Nonspecific chest pain    Rx / DC Orders ED Discharge Orders    None       Malvin Johns, MD 10/09/19 1544

## 2019-10-09 NOTE — ED Notes (Signed)
Pt went to c-t
